# Patient Record
Sex: Male | Born: 1995
Health system: Southern US, Community
[De-identification: ages and names within clinical notes are randomized; demographics above are authoritative.]

## PROBLEM LIST (undated history)

## (undated) DIAGNOSIS — R519 Headache, unspecified: Secondary | ICD-10-CM

## (undated) DIAGNOSIS — K219 Gastro-esophageal reflux disease without esophagitis: Secondary | ICD-10-CM

## (undated) DIAGNOSIS — S0990XA Unspecified injury of head, initial encounter: Secondary | ICD-10-CM

## (undated) DIAGNOSIS — Z0000 Encounter for general adult medical examination without abnormal findings: Secondary | ICD-10-CM

## (undated) DIAGNOSIS — F329 Major depressive disorder, single episode, unspecified: Secondary | ICD-10-CM

## (undated) DIAGNOSIS — Q793 Gastroschisis: Principal | ICD-10-CM

## (undated) DIAGNOSIS — Z00129 Encounter for routine child health examination without abnormal findings: Secondary | ICD-10-CM

## (undated) DIAGNOSIS — F32A Depression, unspecified: Secondary | ICD-10-CM

## (undated) DIAGNOSIS — R51 Headache: Secondary | ICD-10-CM

## (undated) DIAGNOSIS — S42009A Fracture of unspecified part of unspecified clavicle, initial encounter for closed fracture: Secondary | ICD-10-CM

## (undated) HISTORY — DX: Encounter for routine child health examination without abnormal findings: Z00.129

## (undated) HISTORY — DX: Encounter for general adult medical examination without abnormal findings: Z00.00

## (undated) HISTORY — PX: BRAIN SURGERY: SHX531

## (undated) HISTORY — DX: Unspecified injury of head, initial encounter: S09.90XA

## (undated) HISTORY — DX: Gastroschisis: Q79.3

---
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
## 2017-03-14 ENCOUNTER — Encounter: Payer: Self-pay | Admitting: Family

## 2017-03-14 ENCOUNTER — Ambulatory Visit (INDEPENDENT_AMBULATORY_CARE_PROVIDER_SITE_OTHER): Payer: 59 | Admitting: Family

## 2017-03-14 ENCOUNTER — Telehealth: Payer: Self-pay | Admitting: Family Medicine

## 2017-03-14 VITALS — BP 111/71 | HR 87 | Temp 98.4°F | Resp 16 | Ht 68.0 in | Wt 170.6 lb

## 2017-03-14 DIAGNOSIS — R053 Chronic cough: Secondary | ICD-10-CM

## 2017-03-14 DIAGNOSIS — J209 Acute bronchitis, unspecified: Secondary | ICD-10-CM

## 2017-03-14 DIAGNOSIS — H6692 Otitis media, unspecified, left ear: Secondary | ICD-10-CM

## 2017-03-14 DIAGNOSIS — J440 Chronic obstructive pulmonary disease with acute lower respiratory infection: Secondary | ICD-10-CM

## 2017-03-14 DIAGNOSIS — R05 Cough: Secondary | ICD-10-CM

## 2017-03-14 MED ORDER — ALBUTEROL SULFATE HFA 108 (90 BASE) MCG/ACT IN AERS: 2.0000 | INHALATION_SPRAY | Freq: Four times a day (QID) | RESPIRATORY_TRACT | 0 refills | Status: AC | PRN

## 2017-03-14 MED ORDER — BENZONATATE 100 MG PO CAPS: 100.0000 mg | ORAL_CAPSULE | Freq: Three times a day (TID) | ORAL | 0 refills | Status: AC | PRN

## 2017-03-14 MED ORDER — AZITHROMYCIN 250 MG PO TABS
ORAL_TABLET | ORAL | 0 refills | Status: DC
Start: 2017-03-14 — End: 2017-07-03

## 2017-03-14 NOTE — Telephone Encounter (Signed)
Patient Name: Mario [name]  DOB: 1995/08/15    Initial Comment Caller states they're having trouble breathing and coughing up blood. Has been going on for a couple of days.   Nurse Assessment  Nurse: Renaldo FiddlerAdkins, RN, Raynelle FanningJulie Date/Time Mario [name](Eastern Time): 03/14/2017 8:02:40 AM  Confirm and document reason for call. If symptomatic, describe symptoms. ---Caller states he has been having difficulty breathing and has coughed up streaks 6 x in the last 2 days.  Does the patient have any new or worsening symptoms? ---Yes  Will a triage be completed? ---Yes  Related visit to physician within the last 2 weeks? ---No  Does the PT have any chronic conditions? (i.e. diabetes, asthma, etc.) ---No  Is this a behavioral health or substance abuse call? ---No     Guidelines    Guideline Title Affirmed Question Affirmed Notes  Coughing Up Blood [1] Coughed up blood AND [2] > 1 tablespoon (15 ml) (Exception: blood-tinged sputum)    Final Disposition User   See Physician within 4 Hours (or PCP triage) Renaldo FiddlerAdkins, RN, Raynelle FanningJulie    Referrals  REFERRED TO PCP OFFICE   Caller Disagree/Comply Comply  Caller Understands Yes  PreDisposition Call Doctor

## 2017-03-14 NOTE — Telephone Encounter (Signed)
Attempted to reach pt for further information and male answered saying she was pt's room mate and he was not with her at present. States his mobile phone service is messed up and not functioning at this time. She states she could get message to him via facebook messenger if we need to relay information to him but he has no way to contact us at this time. Advised no further recommendation. Pt had previously scheduled an appointment with NP, Peggyann Juba'Sullivan for today at 9:40am.
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
## 2017-03-14 NOTE — Telephone Encounter (Signed)
Noted  

## 2017-03-14 NOTE — Patient Instructions (Signed)
Please begin zpak (antibiotic) for left sided ear infection and bronchitis. You may use tessalon as needed for cough.  I would also recommend that you use albuterol 2 puffs every 6 hours for the next few days. Call if new/worsening symptoms or if symptoms are not improved in 3-4 days.

## 2017-06-30 ENCOUNTER — Emergency Department (HOSPITAL_COMMUNITY): Payer: 59

## 2017-06-30 ENCOUNTER — Emergency Department (HOSPITAL_COMMUNITY)
Admission: EM | Admit: 2017-06-30 | Discharge: 2017-06-30 | Disposition: A | Discharge status: Home / Self Care | Payer: 59 | Attending: Emergency Medicine | Admitting: Emergency Medicine

## 2017-06-30 ENCOUNTER — Encounter (HOSPITAL_COMMUNITY): Payer: Self-pay

## 2017-06-30 DIAGNOSIS — Y92410 Unspecified street and highway as the place of occurrence of the external cause: Secondary | ICD-10-CM | POA: Diagnosis not present

## 2017-06-30 DIAGNOSIS — Y9389 Activity, other specified: Secondary | ICD-10-CM | POA: Diagnosis not present

## 2017-06-30 DIAGNOSIS — S42022A Displaced fracture of shaft of left clavicle, initial encounter for closed fracture: Secondary | ICD-10-CM | POA: Diagnosis not present

## 2017-06-30 DIAGNOSIS — F17210 Nicotine dependence, cigarettes, uncomplicated: Secondary | ICD-10-CM | POA: Insufficient documentation

## 2017-06-30 DIAGNOSIS — Y998 Other external cause status: Secondary | ICD-10-CM | POA: Insufficient documentation

## 2017-06-30 DIAGNOSIS — S4992XA Unspecified injury of left shoulder and upper arm, initial encounter: Secondary | ICD-10-CM | POA: Diagnosis present

## 2017-06-30 MED ORDER — HYDROCODONE-ACETAMINOPHEN 5-325 MG PO TABS: 2.0000 | ORAL_TABLET | ORAL | 0 refills | Status: DC | PRN

## 2017-06-30 MED ORDER — HYDROCODONE-ACETAMINOPHEN 5-325 MG PO TABS
2.0000 | ORAL_TABLET | Freq: Once | ORAL | Status: AC
  Administered 2017-06-30: 2 via ORAL
  Filled 2017-06-30: qty 2

## 2017-06-30 MED ORDER — IBUPROFEN 800 MG PO TABS: 800.0000 mg | ORAL_TABLET | Freq: Three times a day (TID) | ORAL | 0 refills | Status: AC

## 2017-06-30 MED ORDER — METHOCARBAMOL 500 MG PO TABS: 500.0000 mg | ORAL_TABLET | Freq: Three times a day (TID) | ORAL | 0 refills | Status: AC

## 2017-06-30 NOTE — Discharge Instructions (Signed)
Please follow up with dr. Roda ShuttersXu in the office the next 2 weeks, call on Monday morning to make the appointment  Ibuprofen 3 times a day, hydrocodone as needed for severe pain every 6 hours, Robaxin 500 mg up to 3 times a day for muscle spasms.  Please use the sling immobilizer until you follow-up with the orthopedic surgeon.    Return to the emergency department for severe pain, deformity, changes in color, weakness, numbness or swelling of the arm.

## 2017-06-30 NOTE — ED Notes (Signed)
Ortho paged for left shoulder sling with immobilizer

## 2017-06-30 NOTE — ED Notes (Signed)
Patient verbalizes understanding of discharge instructions. Opportunity for questioning and answers were provided. Armband removed by staff, pt discharged from ED ambulatory.   
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
## 2017-06-30 NOTE — Progress Notes (Signed)
Orthopedic Tech Progress Note Patient Details:  Adria DillJared A Fabro 08/17/1995 [id number]  Ortho Devices Type of Ortho Device: Shoulder immobilizer Ortho Device/Splint Interventions: Application   Post Interventions Patient Tolerated: Well Instructions Provided: Care of device   Saul FordyceJennifer C Ming Kunka 06/30/2017, 3:39 PM

## 2017-07-03 ENCOUNTER — Other Ambulatory Visit: Payer: Self-pay

## 2017-07-03 ENCOUNTER — Encounter (INDEPENDENT_AMBULATORY_CARE_PROVIDER_SITE_OTHER): Payer: Self-pay | Admitting: Orthopaedic Surgery

## 2017-07-03 ENCOUNTER — Ambulatory Visit (INDEPENDENT_AMBULATORY_CARE_PROVIDER_SITE_OTHER): Payer: 59 | Admitting: Orthopaedic Surgery

## 2017-07-03 ENCOUNTER — Encounter (HOSPITAL_COMMUNITY): Payer: Self-pay | Admitting: *Deleted

## 2017-07-03 DIAGNOSIS — S42021A Displaced fracture of shaft of right clavicle, initial encounter for closed fracture: Secondary | ICD-10-CM

## 2017-07-03 DIAGNOSIS — S42022A Displaced fracture of shaft of left clavicle, initial encounter for closed fracture: Secondary | ICD-10-CM | POA: Insufficient documentation

## 2017-07-03 NOTE — Progress Notes (Signed)
Pt denies SOB, chest pain, and being under the care of a cardiologist. Pt denies having a stress test, echo and cardiac cath. Pt denies having an EKG and chest x ray within the last year. Pt denies recent labs. Pt made aware to stop taking vitamins, fish oil and herbal medications. Do not take any NSAIDs ie: Ibuprofen, Advil, Naproxen (Aleve), Motrin, BC and Goody Powder. Pt verbalized understandidng of all pre-op instructions.
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
## 2017-07-04 ENCOUNTER — Encounter (HOSPITAL_COMMUNITY): Payer: Self-pay | Admitting: *Deleted

## 2017-07-04 ENCOUNTER — Ambulatory Visit (HOSPITAL_COMMUNITY): Payer: 59 | Admitting: Anesthesiology

## 2017-07-04 ENCOUNTER — Ambulatory Visit (HOSPITAL_COMMUNITY)
Admission: RE | Admit: 2017-07-04 | Discharge: 2017-07-04 | Disposition: A | Discharge status: Home / Self Care | Payer: 59 | Source: Ambulatory Visit | Attending: Orthopaedic Surgery | Admitting: Orthopaedic Surgery

## 2017-07-04 ENCOUNTER — Encounter (HOSPITAL_COMMUNITY)
Admission: RE | Disposition: A | Discharge status: Home / Self Care | Payer: Self-pay | Source: Ambulatory Visit | Attending: Orthopaedic Surgery

## 2017-07-04 ENCOUNTER — Ambulatory Visit (HOSPITAL_COMMUNITY): Payer: 59

## 2017-07-04 DIAGNOSIS — F17210 Nicotine dependence, cigarettes, uncomplicated: Secondary | ICD-10-CM | POA: Insufficient documentation

## 2017-07-04 DIAGNOSIS — Z419 Encounter for procedure for purposes other than remedying health state, unspecified: Secondary | ICD-10-CM

## 2017-07-04 DIAGNOSIS — F329 Major depressive disorder, single episode, unspecified: Secondary | ICD-10-CM | POA: Diagnosis not present

## 2017-07-04 DIAGNOSIS — Z9889 Other specified postprocedural states: Secondary | ICD-10-CM

## 2017-07-04 DIAGNOSIS — S42022A Displaced fracture of shaft of left clavicle, initial encounter for closed fracture: Secondary | ICD-10-CM | POA: Insufficient documentation

## 2017-07-04 DIAGNOSIS — R51 Headache: Secondary | ICD-10-CM | POA: Diagnosis not present

## 2017-07-04 DIAGNOSIS — X58XXXA Exposure to other specified factors, initial encounter: Secondary | ICD-10-CM | POA: Insufficient documentation

## 2017-07-04 DIAGNOSIS — S42022D Displaced fracture of shaft of left clavicle, subsequent encounter for fracture with routine healing: Secondary | ICD-10-CM | POA: Diagnosis not present

## 2017-07-04 DIAGNOSIS — K219 Gastro-esophageal reflux disease without esophagitis: Secondary | ICD-10-CM | POA: Diagnosis not present

## 2017-07-04 HISTORY — DX: Gastro-esophageal reflux disease without esophagitis: K21.9

## 2017-07-04 HISTORY — DX: Headache, unspecified: R51.9

## 2017-07-04 HISTORY — DX: Major depressive disorder, single episode, unspecified: F32.9

## 2017-07-04 HISTORY — DX: Fracture of unspecified part of unspecified clavicle, initial encounter for closed fracture: S42.009A

## 2017-07-04 HISTORY — DX: Depression, unspecified: F32.A

## 2017-07-04 HISTORY — DX: Headache: R51

## 2017-07-04 HISTORY — PX: ORIF CLAVICULAR FRACTURE: SHX5055

## 2017-07-04 SURGERY — OPEN REDUCTION INTERNAL FIXATION (ORIF) CLAVICULAR FRACTURE
Anesthesia: General | Laterality: Left

## 2017-07-04 MED ORDER — BUPIVACAINE HCL (PF) 0.25 % IJ SOLN
INTRAMUSCULAR | Status: AC
  Filled 2017-07-04: qty 30

## 2017-07-04 MED ORDER — CHLORHEXIDINE GLUCONATE 4 % EX LIQD: 60.0000 mL | Freq: Once | CUTANEOUS | Status: DC

## 2017-07-04 MED ORDER — NEOSTIGMINE METHYLSULFATE 10 MG/10ML IV SOLN
INTRAVENOUS | Status: DC | PRN
  Administered 2017-07-04: 3 mg via INTRAVENOUS

## 2017-07-04 MED ORDER — BUPIVACAINE HCL (PF) 0.25 % IJ SOLN
INTRAMUSCULAR | Status: DC | PRN
  Administered 2017-07-04: 30 mL

## 2017-07-04 MED ORDER — MEPERIDINE HCL 50 MG/ML IJ SOLN: 6.2500 mg | INTRAMUSCULAR | Status: DC | PRN

## 2017-07-04 MED ORDER — GLYCOPYRROLATE 0.2 MG/ML IJ SOLN
INTRAMUSCULAR | Status: DC | PRN
  Administered 2017-07-04: .2 mg via INTRAVENOUS
  Administered 2017-07-04: .4 mg via INTRAVENOUS

## 2017-07-04 MED ORDER — EPINEPHRINE PF 1 MG/ML IJ SOLN
INTRAMUSCULAR | Status: DC | PRN
  Administered 2017-07-04: .15 mL

## 2017-07-04 MED ORDER — EPINEPHRINE PF 1 MG/ML IJ SOLN
INTRAMUSCULAR | Status: AC
  Filled 2017-07-04: qty 1

## 2017-07-04 MED ORDER — PROPOFOL 10 MG/ML IV BOLUS
INTRAVENOUS | Status: AC
  Filled 2017-07-04: qty 40

## 2017-07-04 MED ORDER — CEFAZOLIN SODIUM-DEXTROSE 2-4 GM/100ML-% IV SOLN
2.0000 g | INTRAVENOUS | Status: AC
  Administered 2017-07-04: 2 g via INTRAVENOUS
  Filled 2017-07-04: qty 100

## 2017-07-04 MED ORDER — ONDANSETRON HCL 4 MG/2ML IJ SOLN
INTRAMUSCULAR | Status: DC | PRN
  Administered 2017-07-04: 4 mg via INTRAVENOUS

## 2017-07-04 MED ORDER — CEFAZOLIN SODIUM-DEXTROSE 2-4 GM/100ML-% IV SOLN
INTRAVENOUS | Status: AC
  Filled 2017-07-04: qty 100

## 2017-07-04 MED ORDER — LIDOCAINE HCL (CARDIAC) 20 MG/ML IV SOLN
INTRAVENOUS | Status: DC | PRN
  Administered 2017-07-04: 60 mg via INTRAVENOUS

## 2017-07-04 MED ORDER — MIDAZOLAM HCL 5 MG/5ML IJ SOLN
INTRAMUSCULAR | Status: DC | PRN
  Administered 2017-07-04: 2 mg via INTRAVENOUS

## 2017-07-04 MED ORDER — METHOCARBAMOL 750 MG PO TABS: 750.0000 mg | ORAL_TABLET | Freq: Two times a day (BID) | ORAL | 0 refills | Status: AC | PRN

## 2017-07-04 MED ORDER — ROCURONIUM BROMIDE 100 MG/10ML IV SOLN
INTRAVENOUS | Status: DC | PRN
  Administered 2017-07-04: 50 mg via INTRAVENOUS
  Administered 2017-07-04: 20 mg via INTRAVENOUS

## 2017-07-04 MED ORDER — FENTANYL CITRATE (PF) 100 MCG/2ML IJ SOLN
INTRAMUSCULAR | Status: DC | PRN
  Administered 2017-07-04 (×2): 50 ug via INTRAVENOUS
  Administered 2017-07-04: 100 ug via INTRAVENOUS
  Administered 2017-07-04: 50 ug via INTRAVENOUS

## 2017-07-04 MED ORDER — FENTANYL CITRATE (PF) 250 MCG/5ML IJ SOLN
INTRAMUSCULAR | Status: AC
  Filled 2017-07-04: qty 5

## 2017-07-04 MED ORDER — DEXMEDETOMIDINE HCL IN NACL 200 MCG/50ML IV SOLN
INTRAVENOUS | Status: DC | PRN
  Administered 2017-07-04: 8 ug via INTRAVENOUS
  Administered 2017-07-04: 16 ug via INTRAVENOUS

## 2017-07-04 MED ORDER — PROMETHAZINE HCL 25 MG PO TABS: 25.0000 mg | ORAL_TABLET | Freq: Four times a day (QID) | ORAL | 1 refills | Status: AC | PRN

## 2017-07-04 MED ORDER — ONDANSETRON HCL 4 MG PO TABS: 4.0000 mg | ORAL_TABLET | Freq: Three times a day (TID) | ORAL | 0 refills | Status: AC | PRN

## 2017-07-04 MED ORDER — MIDAZOLAM HCL 2 MG/2ML IJ SOLN
INTRAMUSCULAR | Status: AC
  Filled 2017-07-04: qty 2

## 2017-07-04 MED ORDER — OXYCODONE-ACETAMINOPHEN 5-325 MG PO TABS: 1.0000 | ORAL_TABLET | ORAL | 0 refills | Status: AC | PRN

## 2017-07-04 MED ORDER — ZINC SULFATE 220 (50 ZN) MG PO CAPS: 220.0000 mg | ORAL_CAPSULE | Freq: Every day | ORAL | 0 refills | Status: AC

## 2017-07-04 MED ORDER — PROPOFOL 10 MG/ML IV BOLUS
INTRAVENOUS | Status: DC | PRN
  Administered 2017-07-04: 200 mg via INTRAVENOUS

## 2017-07-04 MED ORDER — CALCIUM CARBONATE-VITAMIN D 500-200 MG-UNIT PO TABS: 1.0000 | ORAL_TABLET | Freq: Three times a day (TID) | ORAL | 12 refills | Status: AC

## 2017-07-04 MED ORDER — 0.9 % SODIUM CHLORIDE (POUR BTL) OPTIME
TOPICAL | Status: DC | PRN
  Administered 2017-07-04: 1000 mL

## 2017-07-04 MED ORDER — HYDROMORPHONE HCL 1 MG/ML IJ SOLN
0.2500 mg | INTRAMUSCULAR | Status: DC | PRN
  Administered 2017-07-04 (×2): 0.5 mg via INTRAVENOUS

## 2017-07-04 MED ORDER — LIDOCAINE 2% (20 MG/ML) 5 ML SYRINGE
INTRAMUSCULAR | Status: AC
  Filled 2017-07-04: qty 5

## 2017-07-04 MED ORDER — HYDROMORPHONE HCL 1 MG/ML IJ SOLN
INTRAMUSCULAR | Status: AC
  Administered 2017-07-04: 0.5 mg via INTRAVENOUS
  Filled 2017-07-04: qty 1

## 2017-07-04 MED ORDER — DEXMEDETOMIDINE HCL IN NACL 200 MCG/50ML IV SOLN
INTRAVENOUS | Status: AC
  Filled 2017-07-04: qty 50

## 2017-07-04 MED ORDER — LACTATED RINGERS IV SOLN
INTRAVENOUS | Status: DC
  Administered 2017-07-04 (×2): via INTRAVENOUS

## 2017-07-04 MED ORDER — ONDANSETRON HCL 4 MG/2ML IJ SOLN: 4.0000 mg | Freq: Once | INTRAMUSCULAR | Status: DC | PRN

## 2017-07-04 MED ORDER — OXYCODONE HCL ER 10 MG PO T12A: 10.0000 mg | EXTENDED_RELEASE_TABLET | Freq: Two times a day (BID) | ORAL | 0 refills | Status: AC

## 2017-07-04 SURGICAL SUPPLY — 43 items
CLOSURE STERI-STRIP 1/2X4 (GAUZE/BANDAGES/DRESSINGS) ×1
CLOSURE WOUND 1/2 X4 (GAUZE/BANDAGES/DRESSINGS) ×1
CLSR STERI-STRIP ANTIMIC 1/2X4 (GAUZE/BANDAGES/DRESSINGS) ×2 IMPLANT
COVER SURGICAL LIGHT HANDLE (MISCELLANEOUS) ×3 IMPLANT
DRAPE C-ARM 42X72 X-RAY (DRAPES) ×3 IMPLANT
DRAPE U-SHAPE 47X51 STRL (DRAPES) ×3 IMPLANT
DRILL 2.6X220MM LONG AO (BIT) ×3 IMPLANT
DRSG TEGADERM 4X4.75 (GAUZE/BANDAGES/DRESSINGS) ×3 IMPLANT
DURAPREP 26ML APPLICATOR (WOUND CARE) ×3 IMPLANT
ELECT CAUTERY BLADE 6.4 (BLADE) ×3 IMPLANT
ELECT REM PT RETURN 9FT ADLT (ELECTROSURGICAL) ×3
ELECTRODE REM PT RTRN 9FT ADLT (ELECTROSURGICAL) ×1 IMPLANT
GAUZE SPONGE 4X4 12PLY STRL LF (GAUZE/BANDAGES/DRESSINGS) ×3 IMPLANT
GAUZE XEROFORM 1X8 LF (GAUZE/BANDAGES/DRESSINGS) ×3 IMPLANT
GLOVE BIOGEL PI IND STRL 7.0 (GLOVE) ×1 IMPLANT
GLOVE BIOGEL PI INDICATOR 7.0 (GLOVE) ×2
GLOVE ECLIPSE 7.0 STRL STRAW (GLOVE) ×3 IMPLANT
GLOVE SKINSENSE NS SZ7.5 (GLOVE) ×4
GLOVE SKINSENSE STRL SZ7.5 (GLOVE) ×2 IMPLANT
GOWN STRL REIN XL XLG (GOWN DISPOSABLE) ×6 IMPLANT
KIT BASIN OR (CUSTOM PROCEDURE TRAY) ×3 IMPLANT
KIT ROOM TURNOVER OR (KITS) ×3 IMPLANT
MANIFOLD NEPTUNE II (INSTRUMENTS) ×3 IMPLANT
NEEDLE HYPO 25GX1X1/2 BEV (NEEDLE) IMPLANT
NS IRRIG 1000ML POUR BTL (IV SOLUTION) ×3 IMPLANT
PACK SHOULDER (CUSTOM PROCEDURE TRAY) ×3 IMPLANT
PAD ARMBOARD 7.5X6 YLW CONV (MISCELLANEOUS) ×6 IMPLANT
PLATE SUPERIOR MIDSHAFT 6H (Plate) ×3 IMPLANT
PUTTY DBX 1CC (Putty) ×3 IMPLANT
PUTTY DBX 1CC DEPUY (Putty) ×1 IMPLANT
SCREW BONE 3.5X14MM (Screw) ×12 IMPLANT
SCREW BONE 3.5X18MM (Screw) ×3 IMPLANT
SLING ARM IMMOBILIZER LRG (SOFTGOODS) ×3 IMPLANT
STRIP CLOSURE SKIN 1/2X4 (GAUZE/BANDAGES/DRESSINGS) ×2 IMPLANT
SUCTION FRAZIER HANDLE 10FR (MISCELLANEOUS) ×2
SUCTION TUBE FRAZIER 10FR DISP (MISCELLANEOUS) ×1 IMPLANT
SUT FIBERWIRE #2 38 REV NDL BL (SUTURE) ×3
SUT MNCRL AB 4-0 PS2 18 (SUTURE) ×3 IMPLANT
SUT VIC AB 2-0 CT1 27 (SUTURE) ×2
SUT VIC AB 2-0 CT1 TAPERPNT 27 (SUTURE) ×1 IMPLANT
SUTURE FIBERWR#2 38 REV NDL BL (SUTURE) ×1 IMPLANT
SYR CONTROL 10ML LL (SYRINGE) IMPLANT
UNDERPAD 30X30 (UNDERPADS AND DIAPERS) ×3 IMPLANT
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
## 2017-07-04 NOTE — Discharge Instructions (Signed)
Postoperative instructions: ° °Weightbearing instructions: non weight bearing ° °Keep your dressing and/or splint clean and dry at all times.  You can remove your dressing on post-operative day #3 and change with a dry/sterile dressing or Band-Aids as needed thereafter.   ° °Incision instructions:  Do not soak your incision for 3 weeks after surgery.  If the incision gets wet, pat dry and do not scrub the incision. ° °Pain control:  You have been given a prescription to be taken as directed for post-operative pain control.  In addition, elevate the operative extremity above the heart at all times to prevent swelling and throbbing pain. ° °Take over-the-counter Colace, 100mg by mouth twice a day while taking narcotic pain medications to help prevent constipation. ° °Follow up appointments: °1) 10-14 days for suture removal and wound check. °2) Dr. Azaliah Carrero as scheduled. ° ° ------------------------------------------------------------------------------------------------------------- ° °After Surgery Pain Control: ° °After your surgery, post-surgical discomfort or pain is likely. This discomfort can last several days to a few weeks. At certain times of the day your discomfort may be more intense.  °Did you receive a nerve block?  °A nerve block can provide pain relief for one hour to two days after your surgery. As long as the nerve block is working, you will experience little or no sensation in the area the surgeon operated on.  °As the nerve block wears off, you will begin to experience pain or discomfort. It is very important that you begin taking your prescribed pain medication before the nerve block fully wears off. Treating your pain at the first sign of the block wearing off will ensure your pain is better controlled and more tolerable when full-sensation returns. Do not wait until the pain is intolerable, as the medicine will be less effective. It is better to treat pain in advance than to try and catch up.  °General  Anesthesia:  °If you did not receive a nerve block during your surgery, you will need to start taking your pain medication shortly after your surgery and should continue to do so as prescribed by your surgeon.  °Pain Medication:  °Most commonly we prescribe Vicodin and Percocet for post-operative pain. Both of these medications contain a combination of acetaminophen (Tylenol®) and a narcotic to help control pain.  °· It takes between 30 and 45 minutes before pain medication starts to work. It is important to take your medication before your pain level gets too intense.  °· Nausea is a common side effect of many pain medications. You will want to eat something before taking your pain medicine to help prevent nausea.  °· If you are taking a prescription pain medication that contains acetaminophen, we recommend that you do not take additional over the counter acetaminophen (Tylenol®).  °Other pain relieving options:  °· Using a cold pack to ice the affected area a few times a day (15 to 20 minutes at a time) can help to relieve pain, reduce swelling and bruising.  °· Elevation of the affected area can also help to reduce pain and swelling. ° ° ° °

## 2017-07-04 NOTE — Transfer of Care (Signed)
Immediate Anesthesia Transfer of Care Note  Patient: Adria DillJared A Lebo  Procedure(s) Performed: OPEN REDUCTION INTERNAL FIXATION (ORIF) LEFT CLAVICLE FRACTURE (Left )  Patient Location: PACU  Anesthesia Type:General  Level of Consciousness: awake, alert , oriented and patient cooperative  Airway & Oxygen Therapy: Patient Spontanous Breathing and Patient connected to nasal cannula oxygen  Post-op Assessment: Report given to RN and Post -op Vital signs reviewed and stable  Post vital signs: Reviewed and stable  Last Vitals:  Vitals:   07/04/17 1240  BP: 131/74  Pulse: 78  Resp: 18  Temp: (!) 36.4 C  SpO2: 100%    Last Pain:  Vitals:   07/04/17 1248  TempSrc:   PainSc: 2          Complications: No apparent anesthesia complications

## 2017-07-04 NOTE — Anesthesia Procedure Notes (Signed)
Procedure Name: Intubation Date/Time: 07/04/2017 2:42 PM Performed by: Fransisca KaufmannMeyer, Izzac Rockett E, CRNA Pre-anesthesia Checklist: Patient identified, Emergency Drugs available, Suction available and Patient being monitored Patient Re-evaluated:Patient Re-evaluated prior to induction Oxygen Delivery Method: Circle System Utilized Preoxygenation: Pre-oxygenation with 100% oxygen Induction Type: IV induction Ventilation: Mask ventilation without difficulty Tube type: Oral Tube size: 7.5 mm Number of attempts: 1 Airway Equipment and Method: Stylet and Oral airway Placement Confirmation: ETT inserted through vocal cords under direct vision,  positive ETCO2 and breath sounds checked- equal and bilateral Secured at: 23 cm Tube secured with: Tape Dental Injury: Teeth and Oropharynx as per pre-operative assessment

## 2017-07-04 NOTE — Anesthesia Preprocedure Evaluation (Signed)
Anesthesia Evaluation  Patient identified by MRN, date of birth, ID band Patient awake    Reviewed: Allergy & Precautions, NPO status , Patient's Chart, lab work & pertinent test results  Airway Mallampati: II  TM Distance: >3 FB Neck ROM: Full    Dental no notable dental hx. (+) Teeth Intact   Pulmonary Current Smoker,    Pulmonary exam normal breath sounds clear to auscultation       Cardiovascular negative cardio ROS Normal cardiovascular exam Rhythm:Regular Rate:Normal     Neuro/Psych  Headaches, PSYCHIATRIC DISORDERS Depression Hx/o CHI    GI/Hepatic Neg liver ROS, GERD  ,  Endo/Other  negative endocrine ROS  Renal/GU negative Renal ROS  negative genitourinary   Musculoskeletal Left Clavicle Fx   Abdominal   Peds  Hematology negative hematology ROS (+)   Anesthesia Other Findings   Reproductive/Obstetrics                             Anesthesia Physical Anesthesia Plan  ASA: II  Anesthesia Plan: General   Post-op Pain Management:    Induction: Intravenous  PONV Risk Score and Plan: 2 and Ondansetron, Midazolam and Treatment may vary due to age or medical condition  Airway Management Planned: Oral ETT  Additional Equipment:   Intra-op Plan:   Post-operative Plan: Extubation in OR  Informed Consent: I have reviewed the patients History and Physical, chart, labs and discussed the procedure including the risks, benefits and alternatives for the proposed anesthesia with the patient or authorized representative who has indicated his/her understanding and acceptance.   Dental advisory given  Plan Discussed with: CRNA, Anesthesiologist and Surgeon  Anesthesia Plan Comments:         Anesthesia Quick Evaluation
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
## 2017-07-04 NOTE — Anesthesia Postprocedure Evaluation (Signed)
Anesthesia Post Note  Patient: Mario DillJared A [name]  Procedure(s) Performed: OPEN REDUCTION INTERNAL FIXATION (ORIF) LEFT CLAVICLE FRACTURE (Left )     Patient location during evaluation: PACU Anesthesia Type: General Level of consciousness: awake and alert Pain management: pain level controlled Vital Signs Assessment: post-procedure vital signs reviewed and stable Respiratory status: spontaneous breathing, nonlabored ventilation, respiratory function stable and patient connected to nasal cannula oxygen Cardiovascular status: blood pressure returned to baseline and stable Postop Assessment: no apparent nausea or vomiting Anesthetic complications: no    Last Vitals:  Vitals:   07/04/17 1735 07/04/17 1750  BP: 115/84 117/78  Pulse: 75 75  Resp: 17 17  Temp:    SpO2: 100% 98%    Last Pain:  Vitals:   07/04/17 1750  TempSrc:   PainSc: 3                  Roben Schliep S

## 2017-07-05 ENCOUNTER — Encounter (HOSPITAL_COMMUNITY): Payer: Self-pay | Admitting: Orthopaedic Surgery

## 2017-07-09 ENCOUNTER — Telehealth (INDEPENDENT_AMBULATORY_CARE_PROVIDER_SITE_OTHER): Payer: Self-pay | Admitting: Orthopaedic Surgery

## 2017-07-09 ENCOUNTER — Other Ambulatory Visit (INDEPENDENT_AMBULATORY_CARE_PROVIDER_SITE_OTHER): Payer: Self-pay | Admitting: Physician Assistant

## 2017-07-09 MED ORDER — HYDROCODONE-ACETAMINOPHEN 5-325 MG PO TABS
1.0000 | ORAL_TABLET | Freq: Four times a day (QID) | ORAL | 0 refills | Status: AC | PRN
Start: 2017-07-09 — End: ?

## 2017-07-09 MED FILL — HYDROCODON-APAP 5-325: 5-325 | 3 days supply | Qty: 30 | Fill #0

## 2017-07-09 NOTE — Telephone Encounter (Signed)
Can you please advise on message

## 2017-07-09 NOTE — Telephone Encounter (Signed)
Patient's mother called stating the pain medication is making him throw up and even with the nausea medication, he is still vomiting.  She wanted to know if there is something else that can be prescribed and called in for him.  It is making him have even more pain.  CB#[phone]. Thank you.

## 2017-07-09 NOTE — Telephone Encounter (Signed)
appt made

## 2017-07-09 NOTE — Telephone Encounter (Signed)
I spoke to mom.  He tolerated the norco, so I will refill that.  Will you call mom and schedule post-op appt?

## 2017-07-16 DIAGNOSIS — F329 Major depressive disorder, single episode, unspecified: Secondary | ICD-10-CM | POA: Diagnosis not present

## 2017-07-16 DIAGNOSIS — F10920 Alcohol use, unspecified with intoxication, uncomplicated: Secondary | ICD-10-CM | POA: Diagnosis not present

## 2017-07-16 DIAGNOSIS — R258 Other abnormal involuntary movements: Secondary | ICD-10-CM | POA: Diagnosis not present

## 2017-07-16 DIAGNOSIS — F1220 Cannabis dependence, uncomplicated: Secondary | ICD-10-CM | POA: Diagnosis not present

## 2017-07-16 DIAGNOSIS — F10129 Alcohol abuse with intoxication, unspecified: Secondary | ICD-10-CM | POA: Diagnosis not present

## 2017-07-16 DIAGNOSIS — F913 Oppositional defiant disorder: Secondary | ICD-10-CM | POA: Diagnosis not present

## 2017-07-16 DIAGNOSIS — F10120 Alcohol abuse with intoxication, uncomplicated: Secondary | ICD-10-CM | POA: Diagnosis not present

## 2017-07-16 DIAGNOSIS — D72829 Elevated white blood cell count, unspecified: Secondary | ICD-10-CM | POA: Diagnosis not present

## 2017-07-17 ENCOUNTER — Encounter (INDEPENDENT_AMBULATORY_CARE_PROVIDER_SITE_OTHER): Payer: Self-pay

## 2017-07-17 ENCOUNTER — Inpatient Hospital Stay (INDEPENDENT_AMBULATORY_CARE_PROVIDER_SITE_OTHER): Payer: 59 | Admitting: Orthopaedic Surgery

## 2017-07-17 DIAGNOSIS — F329 Major depressive disorder, single episode, unspecified: Secondary | ICD-10-CM | POA: Diagnosis not present

## 2017-07-17 DIAGNOSIS — F10120 Alcohol abuse with intoxication, uncomplicated: Secondary | ICD-10-CM | POA: Diagnosis not present

## 2017-07-17 DIAGNOSIS — F10920 Alcohol use, unspecified with intoxication, uncomplicated: Secondary | ICD-10-CM | POA: Diagnosis not present

## 2017-07-17 DIAGNOSIS — D72829 Elevated white blood cell count, unspecified: Secondary | ICD-10-CM | POA: Diagnosis not present

## 2017-07-17 DIAGNOSIS — F1220 Cannabis dependence, uncomplicated: Secondary | ICD-10-CM | POA: Diagnosis not present

## 2017-07-17 DIAGNOSIS — F913 Oppositional defiant disorder: Secondary | ICD-10-CM | POA: Diagnosis not present

## 2017-07-19 DIAGNOSIS — H52223 Regular astigmatism, bilateral: Secondary | ICD-10-CM | POA: Diagnosis not present

## 2017-07-24 ENCOUNTER — Ambulatory Visit (INDEPENDENT_AMBULATORY_CARE_PROVIDER_SITE_OTHER): Payer: 59

## 2017-07-24 ENCOUNTER — Ambulatory Visit (INDEPENDENT_AMBULATORY_CARE_PROVIDER_SITE_OTHER): Payer: 59 | Admitting: Orthopaedic Surgery

## 2017-07-24 ENCOUNTER — Encounter (INDEPENDENT_AMBULATORY_CARE_PROVIDER_SITE_OTHER): Payer: Self-pay | Admitting: Orthopaedic Surgery

## 2017-07-24 DIAGNOSIS — M898X1 Other specified disorders of bone, shoulder: Secondary | ICD-10-CM | POA: Insufficient documentation
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
## 2017-07-27 ENCOUNTER — Encounter (HOSPITAL_COMMUNITY): Payer: Self-pay | Admitting: Orthopaedic Surgery

## 2017-08-07 ENCOUNTER — Ambulatory Visit (INDEPENDENT_AMBULATORY_CARE_PROVIDER_SITE_OTHER): Payer: 59 | Admitting: Orthopaedic Surgery

## 2018-10-21 DIAGNOSIS — H5213 Myopia, bilateral: Secondary | ICD-10-CM | POA: Diagnosis not present
